# Patient Record
Sex: Male | Born: 1950 | Race: White | Hispanic: No | Smoking: Current every day smoker
Health system: Southern US, Community
[De-identification: ages and names within clinical notes are randomized; demographics above are authoritative.]

## PROBLEM LIST (undated history)

## (undated) HISTORY — PX: APPENDECTOMY: SHX54

## (undated) HISTORY — PX: KNEE SURGERY: SHX244

## (undated) HISTORY — PX: CARPAL TUNNEL RELEASE: SHX101

---

## 2005-11-01 ENCOUNTER — Encounter: Admission: RE | Admit: 2005-11-01 | Discharge: 2005-11-01 | Payer: Self-pay | Admitting: Internal Medicine

## 2009-09-12 ENCOUNTER — Encounter: Admission: RE | Admit: 2009-09-12 | Discharge: 2009-09-12 | Payer: Self-pay | Admitting: Internal Medicine

## 2011-05-10 ENCOUNTER — Other Ambulatory Visit: Payer: Self-pay | Admitting: Internal Medicine

## 2011-05-10 DIAGNOSIS — R270 Ataxia, unspecified: Secondary | ICD-10-CM

## 2011-05-10 DIAGNOSIS — R251 Tremor, unspecified: Secondary | ICD-10-CM

## 2011-05-14 ENCOUNTER — Ambulatory Visit
Admission: RE | Admit: 2011-05-14 | Discharge: 2011-05-14 | Disposition: A | Discharge status: Home / Self Care | Payer: Commercial Managed Care - PPO | Source: Ambulatory Visit | Attending: Internal Medicine | Admitting: Internal Medicine

## 2011-05-14 DIAGNOSIS — R270 Ataxia, unspecified: Secondary | ICD-10-CM

## 2011-05-14 DIAGNOSIS — R251 Tremor, unspecified: Secondary | ICD-10-CM

## 2011-12-07 ENCOUNTER — Other Ambulatory Visit: Payer: Self-pay | Admitting: Internal Medicine

## 2011-12-07 ENCOUNTER — Ambulatory Visit
Admission: RE | Admit: 2011-12-07 | Discharge: 2011-12-07 | Disposition: A | Discharge status: Home / Self Care | Payer: Commercial Managed Care - PPO | Source: Ambulatory Visit | Attending: Internal Medicine | Admitting: Internal Medicine

## 2011-12-07 DIAGNOSIS — R52 Pain, unspecified: Secondary | ICD-10-CM

## 2011-12-07 DIAGNOSIS — R609 Edema, unspecified: Secondary | ICD-10-CM

## 2012-10-06 ENCOUNTER — Other Ambulatory Visit: Payer: Self-pay | Admitting: Internal Medicine

## 2012-10-06 ENCOUNTER — Ambulatory Visit
Admission: RE | Admit: 2012-10-06 | Discharge: 2012-10-06 | Disposition: A | Discharge status: Home / Self Care | Payer: Commercial Managed Care - PPO | Source: Ambulatory Visit | Attending: Internal Medicine | Admitting: Internal Medicine

## 2012-10-06 DIAGNOSIS — W19XXXA Unspecified fall, initial encounter: Secondary | ICD-10-CM

## 2012-11-17 ENCOUNTER — Encounter (HOSPITAL_COMMUNITY): Payer: Self-pay | Admitting: *Deleted

## 2012-11-17 ENCOUNTER — Emergency Department (HOSPITAL_COMMUNITY)
Admission: EM | Admit: 2012-11-17 | Discharge: 2012-11-17 | Disposition: A | Discharge status: Home / Self Care | Payer: Commercial Managed Care - PPO | Attending: Emergency Medicine | Admitting: Emergency Medicine

## 2012-11-17 DIAGNOSIS — R42 Dizziness and giddiness: Secondary | ICD-10-CM | POA: Insufficient documentation

## 2012-11-17 DIAGNOSIS — R112 Nausea with vomiting, unspecified: Secondary | ICD-10-CM | POA: Insufficient documentation

## 2012-11-17 DIAGNOSIS — F17200 Nicotine dependence, unspecified, uncomplicated: Secondary | ICD-10-CM | POA: Insufficient documentation

## 2012-11-17 LAB — CBC WITH DIFFERENTIAL/PLATELET
Basophils Absolute: 0 10*3/uL (ref 0.0–0.1)
Basophils Relative: 1 % (ref 0–1)
Eosinophils Absolute: 0.1 10*3/uL (ref 0.0–0.7)
HCT: 42.2 % (ref 39.0–52.0)
Hemoglobin: 14.6 g/dL (ref 13.0–17.0)
Lymphs Abs: 1.3 10*3/uL (ref 0.7–4.0)
MCH: 31.5 pg (ref 26.0–34.0)
MCHC: 34.6 g/dL (ref 30.0–36.0)
Monocytes Absolute: 0.5 10*3/uL (ref 0.1–1.0)
Monocytes Relative: 8 % (ref 3–12)
Neutro Abs: 4.5 10*3/uL (ref 1.7–7.7)
Neutrophils Relative %: 69 % (ref 43–77)
Platelets: 198 10*3/uL (ref 150–400)
RBC: 4.63 MIL/uL (ref 4.22–5.81)
RDW: 12.9 % (ref 11.5–15.5)
WBC: 6.5 10*3/uL (ref 4.0–10.5)

## 2012-11-17 LAB — BASIC METABOLIC PANEL
BUN: 13 mg/dL (ref 6–23)
CO2: 25 mEq/L (ref 19–32)
Calcium: 9 mg/dL (ref 8.4–10.5)
Chloride: 100 mEq/L (ref 96–112)
GFR calc Af Amer: >90 mL/min (ref >90)
GFR calc non Af Amer: >90 mL/min (ref >90)
Glucose, Bld: 133 mg/dL — ABNORMAL HIGH (ref 70–99)
Potassium: 3.6 mEq/L (ref 3.5–5.1)
Sodium: 137 mEq/L (ref 135–145)

## 2012-11-17 LAB — POCT I-STAT TROPONIN I: Troponin i, poc: 0 ng/mL (ref 0.00–0.08)

## 2012-11-17 MED ORDER — ONDANSETRON 4 MG PO TBDP
8.0000 mg | ORAL_TABLET | Freq: Once | ORAL | Status: AC
  Administered 2012-11-17: 8 mg via ORAL
  Filled 2012-11-17: qty 1

## 2012-11-17 MED ORDER — MECLIZINE HCL 25 MG PO TABS: 25.0000 mg | ORAL_TABLET | Freq: Three times a day (TID) | ORAL | Status: AC | PRN

## 2012-11-17 MED ORDER — MECLIZINE HCL 25 MG PO TABS
25.0000 mg | ORAL_TABLET | Freq: Once | ORAL | Status: AC
  Administered 2012-11-17: 25 mg via ORAL
  Filled 2012-11-17: qty 1

## 2012-11-17 NOTE — ED Notes (Signed)
Pt able to tolerate PO fluids without becoming nauseated, able to ambulate without getting dizzy.  EDP Steinl notified.

## 2012-11-17 NOTE — ED Notes (Signed)
Pt states that at about 1600 instantly got dizzy and almost fell.  Nauseated and vomiting.  Pt arrived diaphoretic.  No weakness on either side of body, no speech deficits, and no headaches

## 2012-11-17 NOTE — ED Provider Notes (Addendum)
History     CSN: [id number]  Arrival date & time 11/17/12  1737   First MD Initiated Contact with Patient 11/17/12 1941      Chief Complaint  Patient presents with  . Dizziness  . Emesis    (Consider location/radiation/quality/duration/timing/severity/associated sxs/prior treatment) The history is provided by the patient.  pt c/o acute onset dizziness, room spinning sensation, at rest at work today. States became nauseated. Worse w change in position/head movement. No problems w balance or coordination, no falls. No numbness/weakness. No change in speech or vision. No headache. No ear pain. No decreased hearing or tinnitus. No ear trauma. No nasal congestion, or uri c/o.  States symptoms much improved since onset. Notes hx episodes dizziness in past, but denies prior specific dx.      History reviewed. No pertinent past medical history.  Past Surgical History  Procedure Date  . Appendectomy   . Carpal tunnel release   . Knee surgery     No family history on file.  History  Substance Use Topics  . Smoking status: Current Every Day Smoker  . Smokeless tobacco: Not on file  . Alcohol Use: Yes     Comment: occ      Review of Systems  Constitutional: Negative for fever and chills.  HENT: Negative for hearing loss, ear pain, congestion, rhinorrhea, neck pain and tinnitus.   Eyes: Negative for visual disturbance.  Respiratory: Negative for cough and shortness of breath.   Cardiovascular: Negative for chest pain.  Gastrointestinal: Positive for nausea and vomiting. Negative for abdominal pain and diarrhea.  Genitourinary: Negative for flank pain.  Musculoskeletal: Negative for back pain.  Skin: Negative for rash.  Neurological: Negative for headaches.  Hematological: Does not bruise/bleed easily.  Psychiatric/Behavioral: Negative for confusion.    Allergies  Review of patient's allergies indicates no known allergies.  Home Medications  No current outpatient  prescriptions on file.  BP 136/73  Pulse 80  Temp 97.5 F (36.4 C) (Oral)  Resp 18  SpO2 99%  Physical Exam  Nursing note and vitals reviewed. Constitutional: He is oriented to person, place, and time. He appears well-developed and well-nourished. No distress.  HENT:  Head: Atraumatic.  Nose: Nose normal.  Mouth/Throat: Oropharynx is clear and moist.  Eyes: Pupils are equal, round, and reactive to light.       Right eac w small amt cerumen, tm normal. Left eac/tm normal.   Neck: Neck supple. No tracheal deviation present.  Cardiovascular: Normal rate.   Pulmonary/Chest: Effort normal and breath sounds normal. No accessory muscle usage. No respiratory distress.  Abdominal: Soft. Bowel sounds are normal. He exhibits no distension. There is no tenderness.  Musculoskeletal: Normal range of motion. He exhibits no edema and no tenderness.  Neurological: He is alert and oriented to person, place, and time. No cranial nerve deficit.       Motor intact bil. No pronator drift. Romberg testing normal. Ambulates w steady gait. w rapid head movement/turning side to side, transient unidirectional nystagmus.   Skin: Skin is warm and dry.  Psychiatric: He has a normal mood and affect.    ED Course  Procedures (including critical care time)  Labs Reviewed  BASIC METABOLIC PANEL - Abnormal; Notable for the following:    Glucose, Bld 133 (*)     All other components within normal limits  CBC WITH DIFFERENTIAL  POCT I-STAT TROPONIN I   Results for orders placed during the hospital encounter of 11/17/12  CBC WITH DIFFERENTIAL  Component Value Range   WBC 6.5  4.0 - 10.5 K/uL   RBC 4.63  4.22 - 5.81 MIL/uL   Hemoglobin 14.6  13.0 - 17.0 g/dL   HCT 65.7  84.6 - 96.2 %   MCV 91.1  78.0 - 100.0 fL   MCH 31.5  26.0 - 34.0 pg   MCHC 34.6  30.0 - 36.0 g/dL   RDW 95.2  84.1 - 32.4 %   Platelets 198  150 - 400 K/uL   Neutrophils Relative 69  43 - 77 %   Neutro Abs 4.5  1.7 - 7.7 K/uL    Lymphocytes Relative 21  12 - 46 %   Lymphs Abs 1.3  0.7 - 4.0 K/uL   Monocytes Relative 8  3 - 12 %   Monocytes Absolute 0.5  0.1 - 1.0 K/uL   Eosinophils Relative 2  0 - 5 %   Eosinophils Absolute 0.1  0.0 - 0.7 K/uL   Basophils Relative 1  0 - 1 %   Basophils Absolute 0.0  0.0 - 0.1 K/uL  BASIC METABOLIC PANEL      Component Value Range   Sodium 137  135 - 145 mEq/L   Potassium 3.6  3.5 - 5.1 mEq/L   Chloride 100  96 - 112 mEq/L   CO2 25  19 - 32 mEq/L   Glucose, Bld 133 (*) 70 - 99 mg/dL   BUN 13  6 - 23 mg/dL   Creatinine, Ser 4.01  0.50 - 1.35 mg/dL   Calcium 9.0  8.4 - 02.7 mg/dL   GFR calc non Af Amer >90  >90 mL/min   GFR calc Af Amer >90  >90 mL/min  POCT I-STAT TROPONIN I      Component Value Range   Troponin i, poc 0.00  0.00 - 0.08 ng/mL   Comment 3                MDM  antivert po. zofran po.   Reviewed nursing notes and prior charts for additional history.   Note made of prior ct and mri for dizziness - neg for acute process. ?transient vertigo previously.  Recheck pt remains asymptomatic. Denies any current or recent cp or discomfort. No palpitations. No lightheadedness or syncope. Steady gait. No problems w balance or coordination. Ambulates in ed. Taking fluids well. No numbness/weakness.   Recheck pt asymptomatic, states feels at baseline, requests d/c.  pcp is moreira, discussed closed pcp follow up.     Date: 11/17/2012  Rate: 80  Rhythm: normal sinus rhythm  QRS Axis: normal  Intervals: normal  ST/T Wave abnormalities: normal  Conduction Disutrbances:none  Narrative Interpretation:   Old EKG Reviewed: none available     Suzi Roots, MD 11/17/12 2204  Suzi Roots, MD 11/17/12 2204

## 2012-11-17 NOTE — ED Notes (Signed)
Pt st's he was standing and all of a sudden became dizzy, pt has no medical history, only risk factor for TIA/stroke is smoking.  Pt st's he is still dizzy, but nothing like before.  Pt became nauseous and vomited, no diarrhea. Pt has hx of vertigo.  No neuro abnormalities.  Pt able to balance beside bed.

## 2015-12-19 ENCOUNTER — Other Ambulatory Visit (HOSPITAL_COMMUNITY): Payer: Self-pay | Admitting: Chiropractic Medicine

## 2015-12-19 ENCOUNTER — Ambulatory Visit (HOSPITAL_COMMUNITY): Admission: RE | Admit: 2015-12-19 | Payer: Commercial Managed Care - PPO | Source: Ambulatory Visit

## 2015-12-19 DIAGNOSIS — R52 Pain, unspecified: Secondary | ICD-10-CM

## 2015-12-20 ENCOUNTER — Ambulatory Visit (HOSPITAL_COMMUNITY)
Admission: RE | Admit: 2015-12-20 | Discharge: 2015-12-20 | Disposition: A | Discharge status: Home / Self Care | Payer: Commercial Managed Care - PPO | Source: Ambulatory Visit | Attending: Chiropractic Medicine | Admitting: Chiropractic Medicine

## 2015-12-20 ENCOUNTER — Other Ambulatory Visit (HOSPITAL_COMMUNITY): Payer: Self-pay | Admitting: Chiropractic Medicine

## 2015-12-20 DIAGNOSIS — M542 Cervicalgia: Secondary | ICD-10-CM

## 2015-12-20 DIAGNOSIS — M5030 Other cervical disc degeneration, unspecified cervical region: Secondary | ICD-10-CM | POA: Diagnosis not present

## 2015-12-20 DIAGNOSIS — M545 Low back pain: Secondary | ICD-10-CM

## 2015-12-20 DIAGNOSIS — I2510 Atherosclerotic heart disease of native coronary artery without angina pectoris: Secondary | ICD-10-CM | POA: Diagnosis not present

## 2016-07-28 ENCOUNTER — Other Ambulatory Visit: Payer: Self-pay | Admitting: Internal Medicine

## 2016-07-28 DIAGNOSIS — Z87891 Personal history of nicotine dependence: Secondary | ICD-10-CM

## 2016-08-10 ENCOUNTER — Ambulatory Visit
Admission: RE | Admit: 2016-08-10 | Discharge: 2016-08-10 | Disposition: A | Discharge status: Home / Self Care | Payer: Commercial Managed Care - PPO | Source: Ambulatory Visit | Attending: Internal Medicine | Admitting: Internal Medicine

## 2016-08-10 DIAGNOSIS — Z87891 Personal history of nicotine dependence: Secondary | ICD-10-CM

## 2017-11-23 ENCOUNTER — Other Ambulatory Visit: Payer: Self-pay | Admitting: Internal Medicine

## 2017-11-23 DIAGNOSIS — F17200 Nicotine dependence, unspecified, uncomplicated: Secondary | ICD-10-CM

## 2018-03-10 ENCOUNTER — Ambulatory Visit
Admission: RE | Admit: 2018-03-10 | Discharge: 2018-03-10 | Disposition: A | Discharge status: Home / Self Care | Payer: Commercial Managed Care - PPO | Source: Ambulatory Visit | Attending: Internal Medicine | Admitting: Internal Medicine

## 2018-03-10 DIAGNOSIS — F17200 Nicotine dependence, unspecified, uncomplicated: Secondary | ICD-10-CM

## 2018-10-12 ENCOUNTER — Other Ambulatory Visit: Payer: Self-pay

## 2018-10-12 ENCOUNTER — Emergency Department (HOSPITAL_COMMUNITY): Payer: Commercial Managed Care - PPO

## 2018-10-12 ENCOUNTER — Emergency Department (HOSPITAL_COMMUNITY)
Admission: EM | Admit: 2018-10-12 | Discharge: 2018-10-12 | Disposition: A | Discharge status: Home / Self Care | Payer: Commercial Managed Care - PPO | Attending: Emergency Medicine | Admitting: Emergency Medicine

## 2018-10-12 ENCOUNTER — Encounter (HOSPITAL_COMMUNITY): Payer: Self-pay

## 2018-10-12 DIAGNOSIS — R202 Paresthesia of skin: Secondary | ICD-10-CM | POA: Diagnosis not present

## 2018-10-12 DIAGNOSIS — Z79899 Other long term (current) drug therapy: Secondary | ICD-10-CM | POA: Insufficient documentation

## 2018-10-12 DIAGNOSIS — Y929 Unspecified place or not applicable: Secondary | ICD-10-CM | POA: Diagnosis not present

## 2018-10-12 DIAGNOSIS — Y999 Unspecified external cause status: Secondary | ICD-10-CM | POA: Insufficient documentation

## 2018-10-12 DIAGNOSIS — S12100A Unspecified displaced fracture of second cervical vertebra, initial encounter for closed fracture: Secondary | ICD-10-CM | POA: Diagnosis not present

## 2018-10-12 DIAGNOSIS — Y939 Activity, unspecified: Secondary | ICD-10-CM | POA: Diagnosis not present

## 2018-10-12 DIAGNOSIS — W19XXXA Unspecified fall, initial encounter: Secondary | ICD-10-CM | POA: Diagnosis not present

## 2018-10-12 DIAGNOSIS — S199XXA Unspecified injury of neck, initial encounter: Secondary | ICD-10-CM | POA: Diagnosis present

## 2018-10-12 LAB — COMPREHENSIVE METABOLIC PANEL
ALK PHOS: 62 U/L (ref 38–126)
ALT: 38 U/L (ref 0–44)
ANION GAP: 7 (ref 5–15)
AST: 33 U/L (ref 15–41)
Albumin: 3.8 g/dL (ref 3.5–5.0)
BILIRUBIN TOTAL: 0.5 mg/dL (ref 0.3–1.2)
BUN: 11 mg/dL (ref 8–23)
CALCIUM: 8.8 mg/dL — AB (ref 8.9–10.3)
CO2: 28 mmol/L (ref 22–32)
CREATININE: 0.83 mg/dL (ref 0.61–1.24)
Chloride: 102 mmol/L (ref 98–111)
Glucose, Bld: 116 mg/dL — ABNORMAL HIGH (ref 70–99)
Potassium: 4.5 mmol/L (ref 3.5–5.1)
Sodium: 137 mmol/L (ref 135–145)
TOTAL PROTEIN: 6.8 g/dL (ref 6.5–8.1)

## 2018-10-12 LAB — CBC
HCT: 44 % (ref 39.0–52.0)
HEMOGLOBIN: 14.6 g/dL (ref 13.0–17.0)
MCH: 31 pg (ref 26.0–34.0)
MCHC: 33.2 g/dL (ref 30.0–36.0)
MCV: 93.4 fL (ref 80.0–100.0)
Platelets: 223 10*3/uL (ref 150–400)
RBC: 4.71 MIL/uL (ref 4.22–5.81)
RDW: 12.3 % (ref 11.5–15.5)
WBC: 6.6 10*3/uL (ref 4.0–10.5)
nRBC: 0 % (ref 0.0–0.2)

## 2018-10-12 MED ORDER — SODIUM CHLORIDE 0.9 % IV SOLN: INTRAVENOUS | Status: DC

## 2018-10-12 MED ORDER — OXYCODONE-ACETAMINOPHEN 5-325 MG PO TABS
2.0000 | ORAL_TABLET | Freq: Once | ORAL | Status: AC
  Administered 2018-10-12: 2 via ORAL
  Filled 2018-10-12: qty 2

## 2018-10-12 MED ORDER — OXYCODONE-ACETAMINOPHEN 5-325 MG PO TABS: 1.0000 | ORAL_TABLET | ORAL | 0 refills | Status: AC | PRN

## 2018-10-12 MED ORDER — CYCLOBENZAPRINE HCL 10 MG PO TABS: 10.0000 mg | ORAL_TABLET | Freq: Two times a day (BID) | ORAL | 0 refills | Status: AC | PRN

## 2018-10-12 NOTE — ED Provider Notes (Signed)
[facility]-EMERGENCY DEPT Provider Note   CSN: [id number] Arrival date & time: 10/12/18  1624     History   Chief Complaint Chief Complaint  Patient presents with  . Neck Pain    HPI Jake Hughes is a 67 y.o. male.  HPI 67 yo male presents today with neck pain after ground level fall last night,  States neck pain on awakening- pain is severe and bilateral mid neck.  Some tingling to right jaw.  No pain or weakness in arms or legs.  No previous injury.  States hit head but denies headache or injury to head. Denies other injury.  History reviewed. No pertinent past medical history.  There are no active problems to display for this patient.   Past Surgical History:  Procedure Laterality Date  . APPENDECTOMY    . CARPAL TUNNEL RELEASE    . KNEE SURGERY          Home Medications    Prior to Admission medications   Medication Sig Start Date End Date Taking? Authorizing Provider  buPROPion (WELLBUTRIN XL) 300 MG 24 hr tablet Take 300 mg by mouth daily.    [provider]  ibuprofen (ADVIL,MOTRIN) 200 MG tablet Take 600 mg by mouth every 6 (six) hours as needed. For pain in finger.    [provider]  meclizine (ANTIVERT) 25 MG tablet Take 1 tablet (25 mg total) by mouth 3 (three) times daily as needed. 11/17/12   Cathren Laine, MD  Vilazodone HCl (VIIBRYD) 40 MG TABS Take 40 mg by mouth daily.    [provider]    Family History History reviewed. No pertinent family history.  Social History Social History   Tobacco Use  . Smoking status: Current Every Day Smoker  Substance Use Topics  . Alcohol use: Yes    Comment: occ  . Drug use: No     Allergies   Patient has no known allergies.   Review of Systems Review of Systems  All other systems reviewed and are negative.    Physical Exam Updated Vital Signs BP 137/72 (BP Location: Right Arm)   Pulse 98   Temp 97.8 F (36.6 C) (Oral)   Resp 17   SpO2 97%    Physical Exam  Constitutional: He is oriented to person, place, and time. He appears well-developed and well-nourished.  HENT:  Head: Normocephalic and atraumatic.  Right Ear: External ear normal.  Left Ear: External ear normal.  Nose: Nose normal.  Mouth/Throat: Oropharynx is clear and moist.  Eyes: Pupils are equal, round, and reactive to light. Conjunctivae and EOM are normal.  Neck: No tracheal deviation present. No thyromegaly present.  Diffuse mid cervical spine ttp. Patient does not want to flex or extend neck.  NO external signs of trauma.   Cardiovascular: Normal rate, regular rhythm and normal heart sounds.  Pulmonary/Chest: Effort normal and breath sounds normal.  Abdominal: Soft. Bowel sounds are normal.  Musculoskeletal: Normal range of motion. He exhibits no edema, tenderness or deformity.  Neurological: He is alert and oriented to person, place, and time. He displays normal reflexes. No cranial nerve deficit or sensory deficit. He exhibits normal muscle tone. Coordination normal.  Skin: Skin is warm and dry. Capillary refill takes less than 2 seconds.  Psychiatric: He has a normal mood and affect.  Nursing note and vitals reviewed.    ED Treatments / Results  Labs (all labs ordered are listed, but only abnormal results are displayed) Labs  Reviewed - No data to display  EKG EKG Interpretation  Date/Time:  Sunday October 12 2018 18:26:29 EDT Ventricular Rate:  73 PR Interval:    QRS Duration: 90 QT Interval:  393 QTC Calculation: 433 R Axis:   70 Text Interpretation:  Sinus rhythm Baseline wander in lead(s) V1 V2 Confirmed by Margarita Grizzle [id number]) on 10/12/2018 6:35:23 PM   Radiology Ct Head Wo Contrast  Result Date: 10/12/2018 CLINICAL DATA:  Fall last night with right-sided neck pain and numbness. EXAM: CT HEAD WITHOUT CONTRAST CT CERVICAL SPINE WITHOUT CONTRAST TECHNIQUE: Multidetector CT imaging of the head and cervical spine was performed following  the standard protocol without intravenous contrast. Multiplanar CT image reconstructions of the cervical spine were also generated. COMPARISON:  None. FINDINGS: CT HEAD FINDINGS Brain: No evidence of acute infarction, hemorrhage, hydrocephalus, extra-axial collection or mass lesion/mass effect. Vascular: No hyperdense vessel or unexpected calcification. Skull: No acute finding.Question remote right tripod fracture. Sinuses/Orbits: No evidence of injury. Prior endoscopic sinus surgery with lobulated mucosal thickening at the frontal ethmoidal recesses. CT CERVICAL SPINE FINDINGS Alignment: Negative for listhesis. Skull base and vertebrae: Coronal oblique fracture through the C2 body extending from the left lateral mass posteriorly to the posterior cortex. There is also a horizontal fracture through the right lamina and spinous process, reaching the tip of the right inferior articular process. There is anterior displacement of the fracture by 3-4 mm. Avulsion fracture from the C3 right transverse process anterior tubercle. Small fracture at a C3 superior endplate osteophyte. Soft tissues and spinal canal: No gross canal hematoma Disc levels: Diffuse degenerative disc narrowing and endplate spurring. Disc osteophyte complexes at these levels encroach on the ventral cord, greatest at C3-4. Diffuse facet spurring and uncovertebral ridging multilevel foraminal impingement. Upper chest: Emphysema Other: These results were called by telephone at the time of interpretation on 10/12/2018 at 5:43 pm to Dr. Margarita Grizzle , who verbally acknowledged these results. IMPRESSION: 1. C2 body and left lateral mass fracture. There is fracture widening by 4 mm. 2. C2 right lamina fracture reaching the tip of the inferior facet without subluxation. 3. Avulsion fracture of the C3 right transverse process anterior tubercle. Small fracture at a C3 superior endplate osteophyte. 4. No evidence of intracranial injury. 5. Multilevel disc and  facet degeneration with cord and foraminal impingement. Electronically Signed   By: Marnee Spring M.D.   On: 10/12/2018 17:49   Ct Cervical Spine Wo Contrast  Result Date: 10/12/2018 CLINICAL DATA:  Fall last night with right-sided neck pain and numbness. EXAM: CT HEAD WITHOUT CONTRAST CT CERVICAL SPINE WITHOUT CONTRAST TECHNIQUE: Multidetector CT imaging of the head and cervical spine was performed following the standard protocol without intravenous contrast. Multiplanar CT image reconstructions of the cervical spine were also generated. COMPARISON:  None. FINDINGS: CT HEAD FINDINGS Brain: No evidence of acute infarction, hemorrhage, hydrocephalus, extra-axial collection or mass lesion/mass effect. Vascular: No hyperdense vessel or unexpected calcification. Skull: No acute finding.Question remote right tripod fracture. Sinuses/Orbits: No evidence of injury. Prior endoscopic sinus surgery with lobulated mucosal thickening at the frontal ethmoidal recesses. CT CERVICAL SPINE FINDINGS Alignment: Negative for listhesis. Skull base and vertebrae: Coronal oblique fracture through the C2 body extending from the left lateral mass posteriorly to the posterior cortex. There is also a horizontal fracture through the right lamina and spinous process, reaching the tip of the right inferior articular process. There is anterior displacement of the fracture by 3-4 mm. Avulsion fracture from the C3 right transverse process  anterior tubercle. Small fracture at a C3 superior endplate osteophyte. Soft tissues and spinal canal: No gross canal hematoma Disc levels: Diffuse degenerative disc narrowing and endplate spurring. Disc osteophyte complexes at these levels encroach on the ventral cord, greatest at C3-4. Diffuse facet spurring and uncovertebral ridging multilevel foraminal impingement. Upper chest: Emphysema Other: These results were called by telephone at the time of interpretation on 10/12/2018 at 5:43 pm to Dr. Margarita Grizzle , who verbally acknowledged these results. IMPRESSION: 1. C2 body and left lateral mass fracture. There is fracture widening by 4 mm. 2. C2 right lamina fracture reaching the tip of the inferior facet without subluxation. 3. Avulsion fracture of the C3 right transverse process anterior tubercle. Small fracture at a C3 superior endplate osteophyte. 4. No evidence of intracranial injury. 5. Multilevel disc and facet degeneration with cord and foraminal impingement. Electronically Signed   By: Marnee Spring M.D.   On: 10/12/2018 17:49    Procedures Procedures (including critical care time)  Medications Ordered in ED Medications  oxyCODONE-acetaminophen (PERCOCET/ROXICET) 5-325 MG per tablet 2 tablet (has no administration in time range)     Initial Impression / Assessment and Plan / ED Course  I have reviewed the triage vital signs and the nursing notes.  Pertinent labs & imaging results that were available during my care of the patient were reviewed by me and considered in my medical decision making (see chart for details).     5:49 PM Received call from radiologist, patient with C2 fracture.  Alerted nurse to place hard cervical collar.  Discussed with patient.  Patient's last p.o. intake at 2:00.  He is now made n.p.o.  Will place orders for labs and consult with neurosurgery Discussed with Leo Grosser, NP.    6:39 PM Discussed with Ms. Meyran who states she reviewed films with Dr. Wynetta Emery.  Plan continue hard collar.  Discussed with patient and wife-to call Dr. Lonie Peak office in am for recheck.  Collar to stay in place.  Return if any neuro symptoms.  Final Clinical Impressions(s) / ED Diagnoses   Final diagnoses:  Closed displaced fracture of second cervical vertebra, unspecified fracture morphology, initial encounter [facility])    ED Discharge Orders    None       Margarita Grizzle, MD 10/12/18 [phone]

## 2018-10-12 NOTE — ED Triage Notes (Addendum)
Pt reports that he fell last night d/t vertigo and hit his head. No LOC or external bleeding, but today is complaining of severe neck pain and states that the R side of his neck is becoming numb. A&Ox4. Reports a hx of vertigo. Ambulatory. No neuro deficits.

## 2018-10-12 NOTE — Discharge Instructions (Addendum)
Keep cervical collar in place at all times Call Dr. Lonie Peak office tomorrow for recheck Take pain medicine as needed Return if any numbness or weakness

## 2018-10-20 ENCOUNTER — Other Ambulatory Visit: Payer: Self-pay | Admitting: Neurosurgery

## 2018-10-20 DIAGNOSIS — S12191A Other nondisplaced fracture of second cervical vertebra, initial encounter for closed fracture: Secondary | ICD-10-CM

## 2018-10-26 ENCOUNTER — Ambulatory Visit
Admission: RE | Admit: 2018-10-26 | Discharge: 2018-10-26 | Disposition: A | Discharge status: Home / Self Care | Payer: Commercial Managed Care - PPO | Source: Ambulatory Visit | Attending: Neurosurgery | Admitting: Neurosurgery

## 2018-10-26 DIAGNOSIS — S12191A Other nondisplaced fracture of second cervical vertebra, initial encounter for closed fracture: Secondary | ICD-10-CM

## 2019-02-02 ENCOUNTER — Other Ambulatory Visit: Payer: Self-pay | Admitting: Neurosurgery

## 2019-02-02 DIAGNOSIS — S12191A Other nondisplaced fracture of second cervical vertebra, initial encounter for closed fracture: Secondary | ICD-10-CM

## 2019-02-12 ENCOUNTER — Ambulatory Visit
Admission: RE | Admit: 2019-02-12 | Discharge: 2019-02-12 | Disposition: A | Discharge status: Home / Self Care | Payer: Commercial Managed Care - PPO | Source: Ambulatory Visit | Attending: Neurosurgery | Admitting: Neurosurgery

## 2019-02-12 DIAGNOSIS — S12191A Other nondisplaced fracture of second cervical vertebra, initial encounter for closed fracture: Secondary | ICD-10-CM

## 2019-03-09 ENCOUNTER — Other Ambulatory Visit: Payer: Self-pay | Admitting: Internal Medicine

## 2019-03-09 DIAGNOSIS — Z720 Tobacco use: Secondary | ICD-10-CM

## 2020-01-02 IMAGING — CT CT HEAD W/O CM
3 of 4 series · 13 of 33 positions shown, 16 images · non-contrast
Comparison: None.

CLINICAL DATA: Fall last night with right-sided neck pain and
numbness.

EXAM:
CT HEAD WITHOUT CONTRAST
CT CERVICAL SPINE WITHOUT CONTRAST
TECHNIQUE: Multidetector CT imaging of the head and cervical spine was
performed following the standard protocol without intravenous
contrast. Multiplanar CT image reconstructions of the cervical spine
were also generated.

[Series 7: orthogonal bone · axial · 0.23mm/px · z∈[-294,-171]mm · 5 of 94 slices shown, 7 images]
[im 16/94  soft-tissue]
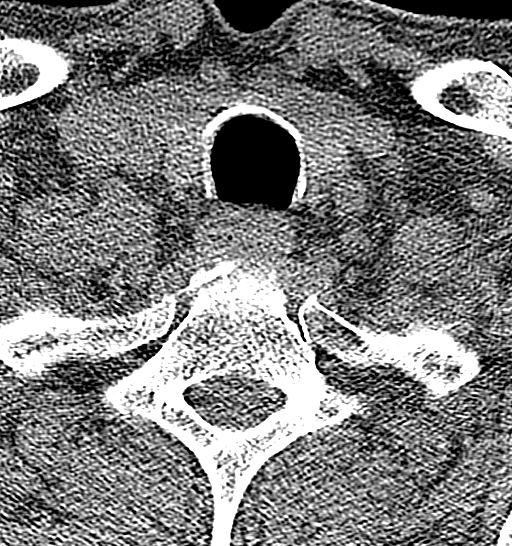
[im 16/94  bone]
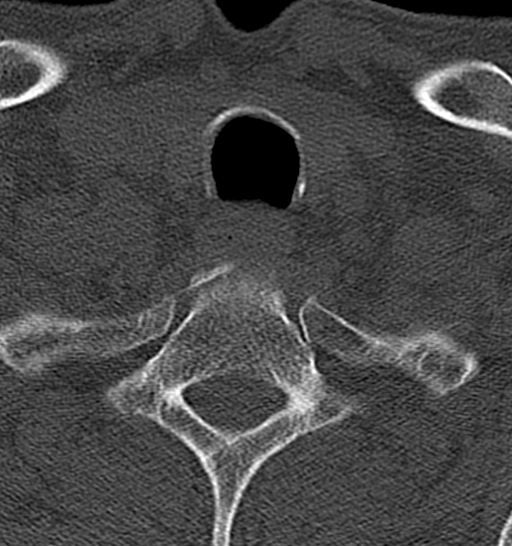
[im 32/94  bone]
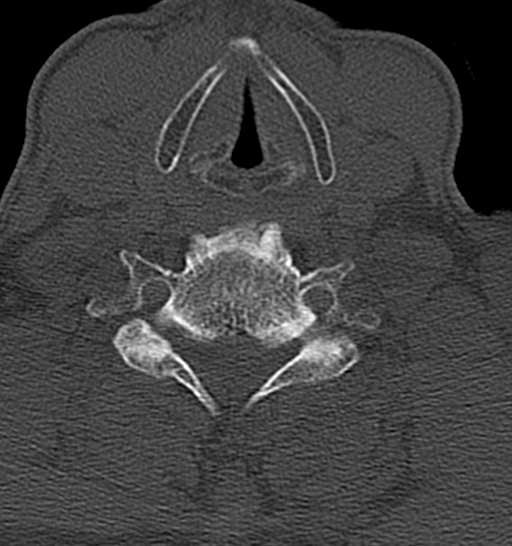
[im 47/94  bone]
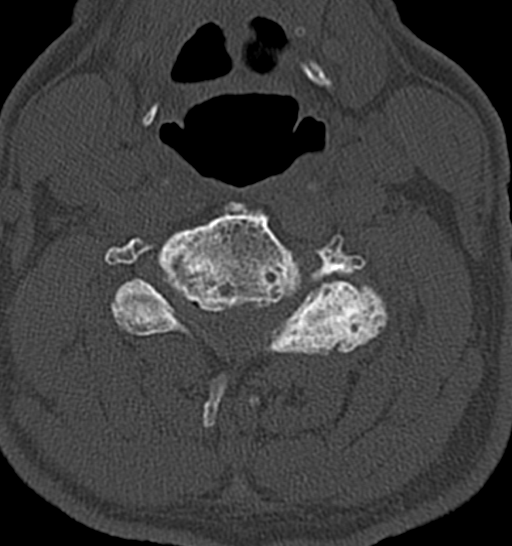
[im 63/94  bone]
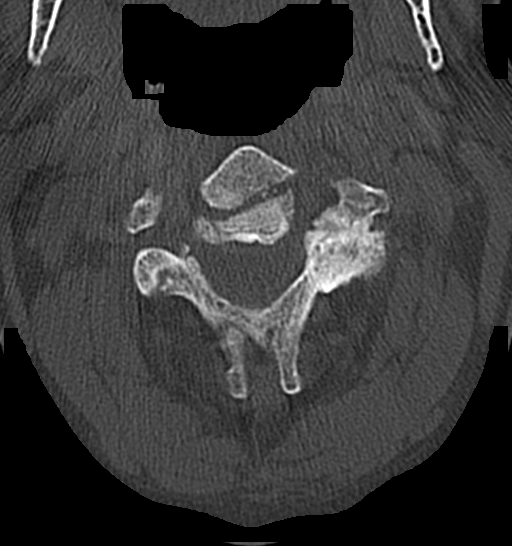
[im 78/94  soft-tissue]
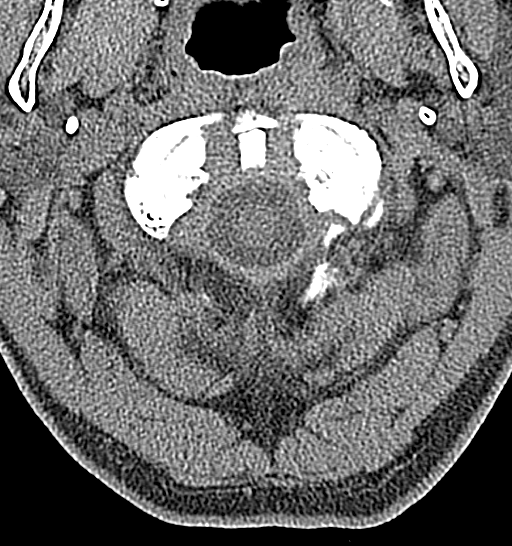
[im 78/94  bone]
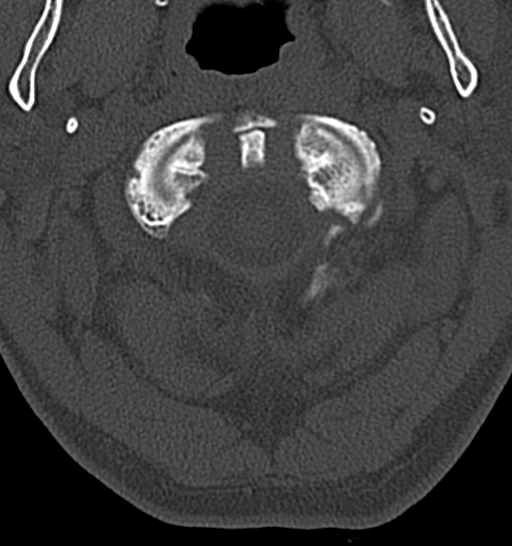

[Series 8: coronal bone · coronal · 0.26mm/px · 3 of 71 slices shown]
[im 15/71  bone]
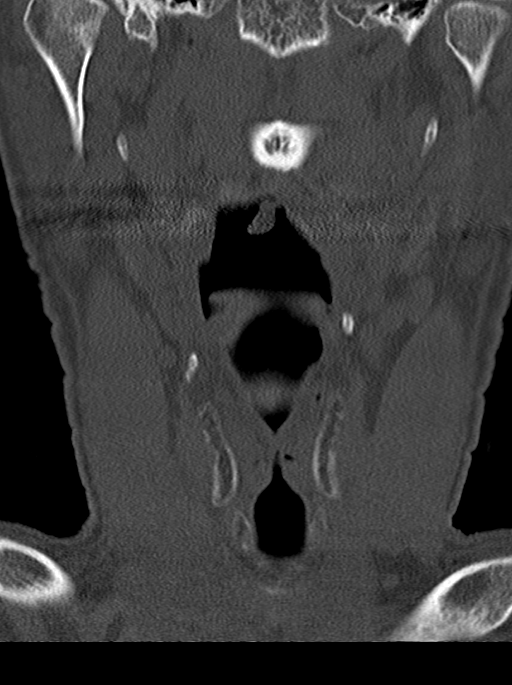
[im 29/71  bone]
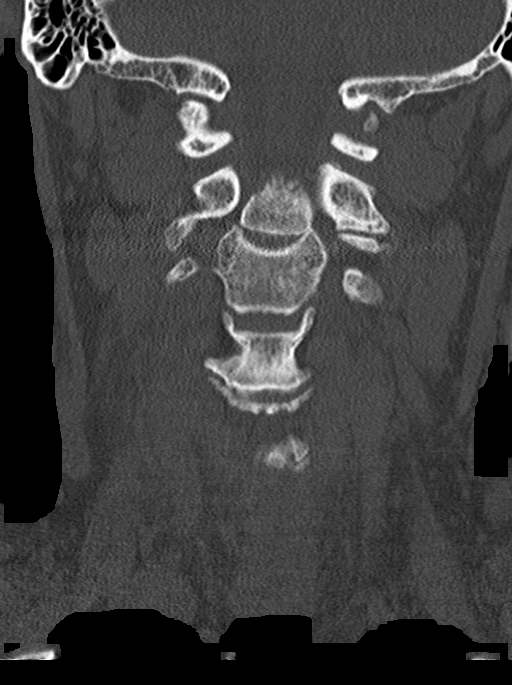
[im 43/71  bone]
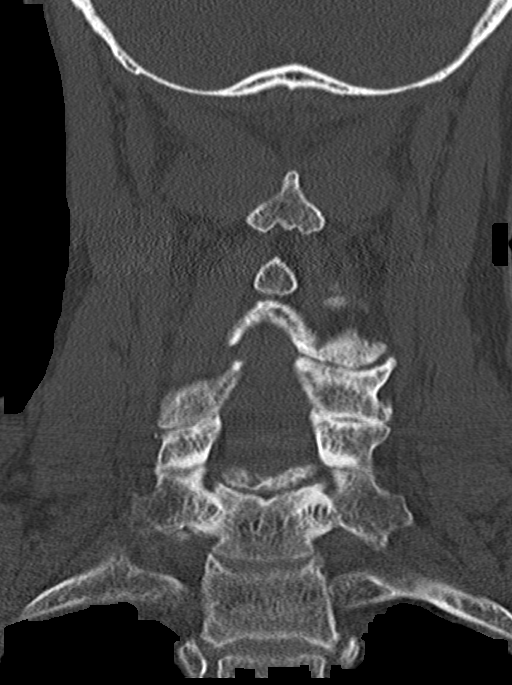

[Series 9: sagittal bone · sagittal · 0.35mm/px · 5 of 61 slices shown, 6 images]
[im 21/61  bone]
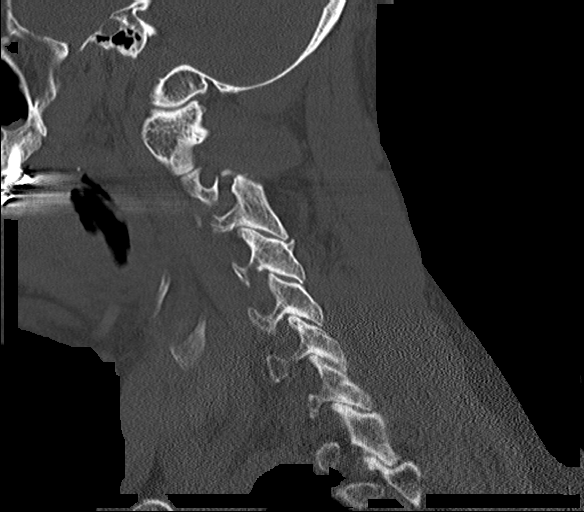
[im 26/61  bone]
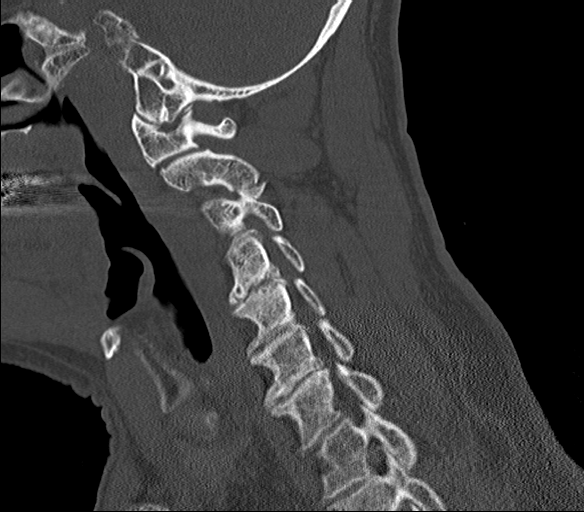
[im 31/61  soft-tissue]
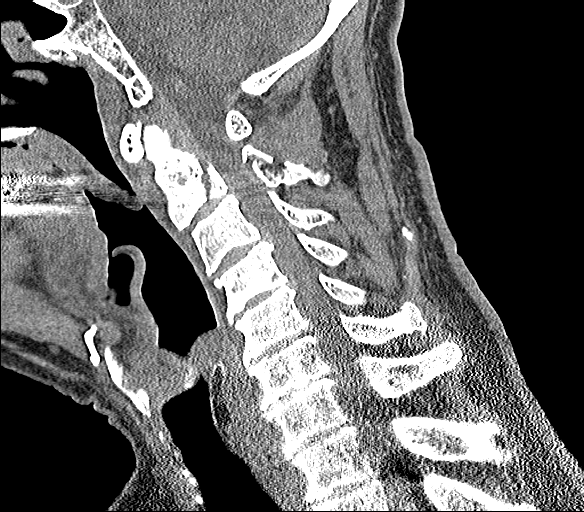
[im 31/61  bone]
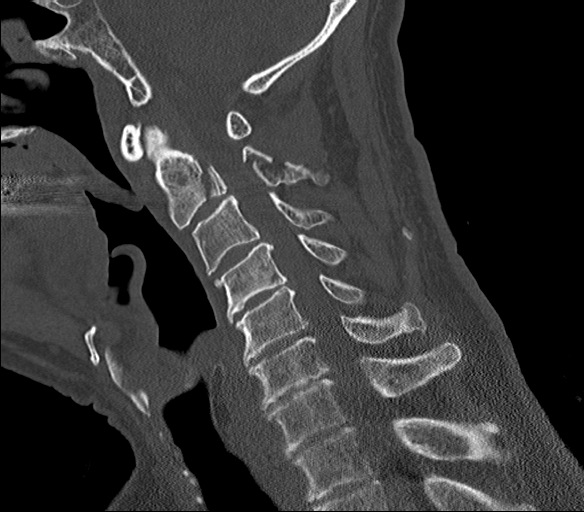
[im 36/61  bone]
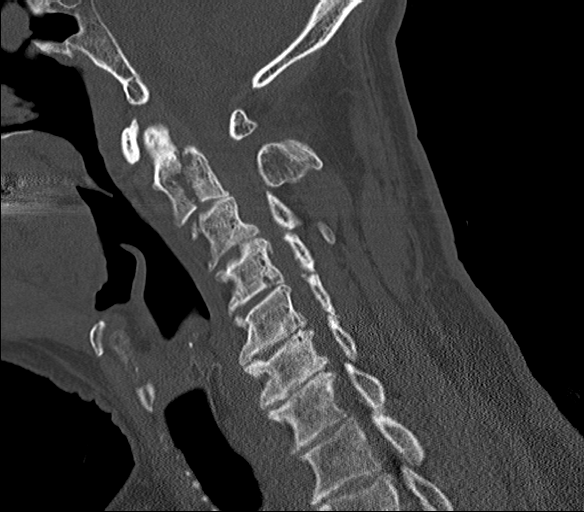
[im 41/61  bone]
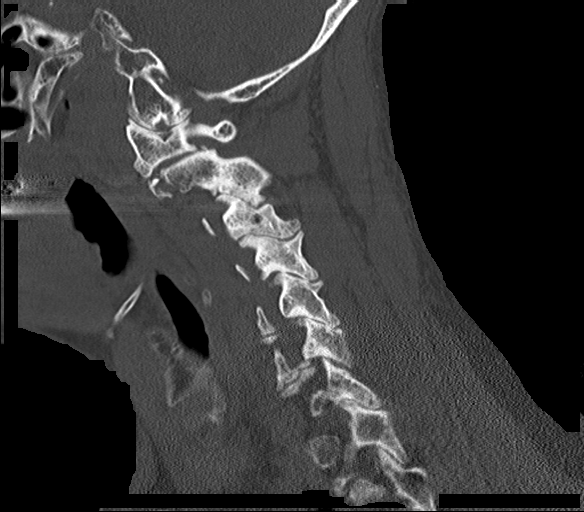

[13 of 33 positions shown; findings below may reference images not displayed]

FINDINGS: CT HEAD FINDINGS

Brain: No evidence of acute infarction, hemorrhage, hydrocephalus,
extra-axial collection or mass lesion/mass effect.

Vascular: No hyperdense vessel or unexpected calcification.

Skull: No acute finding.Question remote right tripod fracture.

Sinuses/Orbits: No evidence of injury. Prior endoscopic sinus
surgery with lobulated mucosal thickening at the frontal ethmoidal
recesses.

CT CERVICAL SPINE FINDINGS

Alignment: Negative for listhesis.

Skull base and vertebrae: Coronal oblique fracture through the C2
body extending from the left lateral mass posteriorly to the
posterior cortex. There is also a horizontal fracture through the
right lamina and spinous process, reaching the tip of the right
inferior articular process. There is anterior displacement of the
fracture by 3-4 mm.

Avulsion fracture from the C3 right transverse process anterior
tubercle. Small fracture at a C3 superior endplate osteophyte.

Soft tissues and spinal canal: No gross canal hematoma

Disc levels: Diffuse degenerative disc narrowing and endplate
spurring. Disc osteophyte complexes at these levels encroach on the
ventral cord, greatest at C3-4. Diffuse facet spurring and
uncovertebral ridging multilevel foraminal impingement.

Upper chest: Emphysema

Other: These results were called by telephone at the time of
interpretation on 10/12/2018 at [DATE] to Dr. TOMOKO BORRELLI , who
verbally acknowledged these results.
IMPRESSION: 1. C2 body and left lateral mass fracture. There is fracture
widening by 4 mm.
2. C2 right lamina fracture reaching the tip of the inferior facet
without subluxation.
3. Avulsion fracture of the C3 right transverse process anterior
tubercle. Small fracture at a C3 superior endplate osteophyte.
4. No evidence of intracranial injury.
5. Multilevel disc and facet degeneration with cord and foraminal
impingement.
